# Patient Record
Sex: Male | Born: 1970 | Race: White | Hispanic: No | Marital: Single | Smoking: Current every day smoker
Health system: Southern US, Community
[De-identification: ages and names within clinical notes are randomized; demographics above are authoritative.]

---

## 2003-10-19 ENCOUNTER — Emergency Department: Payer: Self-pay | Admitting: Emergency Medicine

## 2004-04-15 ENCOUNTER — Emergency Department: Payer: Self-pay | Admitting: Emergency Medicine

## 2012-03-09 ENCOUNTER — Emergency Department: Payer: Self-pay | Admitting: Emergency Medicine

## 2012-03-09 LAB — COMPREHENSIVE METABOLIC PANEL
Albumin: 3.6 g/dL (ref 3.4–5.0)
Alkaline Phosphatase: 96 U/L (ref 50–136)
Anion Gap: 7 (ref 7–16)
BUN: 9 mg/dL (ref 7–18)
Chloride: 99 mmol/L (ref 98–107)
Glucose: 99 mg/dL (ref 65–99)
Osmolality: 267 (ref 275–301)
Potassium: 4.1 mmol/L (ref 3.5–5.1)
SGOT(AST): 15 U/L (ref 15–37)
Total Protein: 7.6 g/dL (ref 6.4–8.2)

## 2012-03-09 LAB — CBC
HCT: 44.5 % (ref 40.0–52.0)
MCHC: 33.4 g/dL (ref 32.0–36.0)
Platelet: 281 10*3/uL (ref 150–440)
RDW: 13 % (ref 11.5–14.5)

## 2012-03-12 LAB — WOUND AEROBIC CULTURE

## 2016-02-29 ENCOUNTER — Emergency Department: Payer: Self-pay

## 2016-02-29 ENCOUNTER — Encounter: Payer: Self-pay | Admitting: Emergency Medicine

## 2016-02-29 ENCOUNTER — Emergency Department
Admission: EM | Admit: 2016-02-29 | Discharge: 2016-02-29 | Disposition: A | Discharge status: Home / Self Care | Payer: Self-pay | Attending: Emergency Medicine | Admitting: Emergency Medicine

## 2016-02-29 DIAGNOSIS — F17200 Nicotine dependence, unspecified, uncomplicated: Secondary | ICD-10-CM | POA: Insufficient documentation

## 2016-02-29 DIAGNOSIS — W208XXA Other cause of strike by thrown, projected or falling object, initial encounter: Secondary | ICD-10-CM | POA: Insufficient documentation

## 2016-02-29 DIAGNOSIS — M542 Cervicalgia: Secondary | ICD-10-CM

## 2016-02-29 DIAGNOSIS — S0003XA Contusion of scalp, initial encounter: Secondary | ICD-10-CM | POA: Insufficient documentation

## 2016-02-29 DIAGNOSIS — Y929 Unspecified place or not applicable: Secondary | ICD-10-CM | POA: Insufficient documentation

## 2016-02-29 DIAGNOSIS — Y999 Unspecified external cause status: Secondary | ICD-10-CM | POA: Insufficient documentation

## 2016-02-29 DIAGNOSIS — S40011A Contusion of right shoulder, initial encounter: Secondary | ICD-10-CM | POA: Insufficient documentation

## 2016-02-29 DIAGNOSIS — Y939 Activity, unspecified: Secondary | ICD-10-CM | POA: Insufficient documentation

## 2016-02-29 MED ORDER — HYDROCODONE-ACETAMINOPHEN 5-325 MG PO TABS: 1.0000 | ORAL_TABLET | ORAL | 0 refills | Status: AC | PRN
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
## 2016-02-29 NOTE — ED Notes (Signed)
Pt alert and oriented X4, active, cooperative, pt in NAD. RR even and unlabored, color WNL.  Pt informed to return if any life threatening symptoms occur.   
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
## 2016-02-29 NOTE — ED Notes (Signed)
Patient transported to CT 

## 2016-02-29 NOTE — Discharge Instructions (Signed)
Apply ice packs to areas as needed for pain or swelling. Norco as needed for pain every 4 hours. [facility] if any continued problems.
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
## 2020-11-05 DEATH — deceased
